# Patient Record
Sex: Male | Born: 1985 | Hispanic: Yes | Marital: Married | Smoking: Never smoker
Health system: Southern US, Community
[De-identification: ages and names within clinical notes are randomized; demographics above are authoritative.]

---

## 2020-12-14 ENCOUNTER — Emergency Department (HOSPITAL_COMMUNITY): Payer: No Typology Code available for payment source

## 2020-12-14 ENCOUNTER — Emergency Department (HOSPITAL_COMMUNITY)
Admission: EM | Admit: 2020-12-14 | Discharge: 2020-12-14 | Disposition: A | Discharge status: Home / Self Care | Payer: No Typology Code available for payment source | Attending: Emergency Medicine | Admitting: Emergency Medicine

## 2020-12-14 ENCOUNTER — Encounter (HOSPITAL_COMMUNITY): Payer: Self-pay

## 2020-12-14 DIAGNOSIS — Y92410 Unspecified street and highway as the place of occurrence of the external cause: Secondary | ICD-10-CM | POA: Diagnosis not present

## 2020-12-14 DIAGNOSIS — M545 Low back pain, unspecified: Secondary | ICD-10-CM | POA: Diagnosis present

## 2020-12-14 DIAGNOSIS — R0789 Other chest pain: Secondary | ICD-10-CM | POA: Diagnosis not present

## 2020-12-14 DIAGNOSIS — R519 Headache, unspecified: Secondary | ICD-10-CM | POA: Diagnosis not present

## 2020-12-14 DIAGNOSIS — M25512 Pain in left shoulder: Secondary | ICD-10-CM | POA: Diagnosis not present

## 2020-12-14 MED ORDER — METHOCARBAMOL 500 MG PO TABS: 500.0000 mg | ORAL_TABLET | Freq: Two times a day (BID) | ORAL | 0 refills | Status: DC

## 2020-12-14 MED ORDER — HYDROCODONE-ACETAMINOPHEN 5-325 MG PO TABS
1.0000 | ORAL_TABLET | Freq: Once | ORAL | Status: AC
  Administered 2020-12-14: 1 via ORAL
  Filled 2020-12-14: qty 1

## 2020-12-14 NOTE — Discharge Instructions (Signed)
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
## 2020-12-20 ENCOUNTER — Ambulatory Visit: Payer: Self-pay | Admitting: Physician Assistant

## 2020-12-20 ENCOUNTER — Other Ambulatory Visit: Payer: Self-pay

## 2020-12-20 VITALS — BP 135/80 | HR 84 | Temp 98.2°F | Resp 18 | Ht 68.0 in | Wt 215.0 lb

## 2020-12-20 DIAGNOSIS — M5442 Lumbago with sciatica, left side: Secondary | ICD-10-CM

## 2020-12-20 MED ORDER — IBUPROFEN 400 MG PO TABS: ORAL_TABLET | ORAL | 0 refills | Status: DC

## 2020-12-20 MED ORDER — METHYLPREDNISOLONE ACETATE 80 MG/ML IJ SUSP
80.0000 mg | Freq: Once | INTRAMUSCULAR | Status: AC
  Administered 2020-12-20: 80 mg via INTRAMUSCULAR

## 2020-12-20 MED ORDER — KETOROLAC TROMETHAMINE 30 MG/ML IJ SOLN
30.0000 mg | Freq: Once | INTRAMUSCULAR | Status: AC
  Administered 2020-12-20: 30 mg via INTRAMUSCULAR
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
## 2020-12-20 NOTE — Patient Instructions (Signed)
I encourage you to use the muscle relaxers and ibuprofen as directed.  Make sure to continue staying very well-hydrated, get plenty of rest, and doing the gentle stretching.  Please feel free to return to the mobile unit if needed.  Roney Jaffe, PA-C Physician Assistant [facility] [email or web address]   Citica Sciatica  La citica es el dolor, entumecimiento, debilidad u hormigueo a lo largo del nervio citico. El nervio citico comienza en la parte inferior de la espalda y desciende por la parte posterior de cada pierna. Controla los msculos en la parte inferior de las piernas y en la parte posterior de las rodillas. Tambin proporciona sensibilidad a la parte posterior de los muslos, la parte inferior de las piernas y la planta de los pies. La citica es un sntoma de otra afeccin que ejerce presin o "pellizca" el nervio citico. Con mayor frecuencia, la citica afecta a un solo lado del cuerpo. Suele desaparecer por s sola o con tratamiento. En algunos casos, la Hydrologist (ser recurrente). Cules son las causas? Esta afeccin es causada por una presin sobre el nervio citico o "pellizco" del nervio citico. Esto puede ser el resultado de:  Un disco que sobresale demasiado entre los huesos de la columna vertebral (hernia de disco).  Cambios en los discos vertebrales relacionados con la edad.  Un trastorno doloroso que afecta un msculo de las nalgas.  Un crecimiento seo adicional cerca del nervio citico.  Una rotura (fractura) de la pelvis.  Embarazo.  Tumor. Esto es poco frecuente. Qu incrementa el riesgo? Los siguientes factores pueden hacer que sea ms propenso a Aeronautical engineer afeccin:  Microbiologist que ponen presin o tensin sobre la columna vertebral.  Tener poca fuerza y flexibilidad.  Antecedentes de ciruga o lesin en la espalda.  Estar sentado durante largos perodos de  [place].  Realizar actividades que requieren agacharse o levantar objetos en forma repetida.  Obesidad. Cules son los signos o los sntomas? Los sntomas pueden ser leves o graves, y pueden incluir los siguientes:  Cualquiera de los siguientes problemas en la parte inferior de la espalda, piernas, cadera o nalgas: ? Hormigueo leve, adormecimiento o dolor sordo. ? Sensacin de ardor. ? Dolor agudo.  Adormecimiento de la parte posterior de la pantorrilla o la planta del pie.  Debilidad en las piernas.  Dolor de espalda intenso que dificulta el movimiento. Los sntomas podran empeorar al toser, Engineering geologist o rerse, o cuando se est sentado o de pie durante perodos prolongados. Cmo se diagnostica? Esta afeccin se puede diagnosticar en funcin de lo siguiente:  Los sntomas y los antecedentes mdicos.  Un examen fsico.  Anlisis de sangre.  Pruebas de diagnstico por imgenes, por ejemplo: ? Radiografas. ? Resonancia magntica (RM). ? Exploracin por tomografa computarizada (TC). Cmo se trata? En muchos casos, esta afeccin mejora por s sola, sin tratamiento. Sin embargo, el tratamiento puede incluir:  Reduccin o modificacin la actividad fsica.  Ejercicios y estiramientos.  Aplicacin de calor o hielo en la zona afectada.  Medicamentos para lo siguiente: ? Aliviar el dolor y la inflamacin. ? Relajar los msculos.  Medicamentos inyectables que ayudan a Engineer, materials, la irritacin y la inflamacin alrededor del nervio citico (corticoesteroides).  Ciruga. Siga estas instrucciones en su casa: Medicamentos  Tome los medicamentos de venta libre y los recetados solamente como se lo haya indicado el mdico.  Pregntele al mdico si el medicamento recetado: ? Hace que sea necesario que evite conducir o usar [employer]  pesada. ? Puede causarle estreimiento. Es posible que tenga que tomar estas medidas para prevenir o tratar el estreimiento:  Product manager  suficiente lquido como para Pharmacologist la orina de color amarillo plido.  Tomar medicamentos recetados o de [employer].  Consumir alimentos ricos en fibra, como frijoles, cereales integrales, y frutas y verduras frescas.  Limitar el consumo de alimentos ricos en grasa y azcares procesados, como los alimentos fritos o dulces. Control del dolor  Si se lo indican, aplique hielo sobre la zona afectada. ? Ponga el hielo en una bolsa plstica. ? Coloque una [employer] piel y Copy. ? Coloque el hielo durante , 2 a 3veces por da.  Si se lo indican, aplique calor en la zona afectada. Use la fuente de calor que el mdico le recomiende, como una compresa de calor hmedo o una almohadilla trmica. ? Coloque una [employer] piel y la fuente de Airline pilot. ? Aplique calor durante 20 a . ? Retire la fuente de calor si la piel se pone de color rojo brillante. Esto es especialmente importante si no puede sentir dolor, calor o fro. Puede correr un riesgo mayor de sufrir quemaduras.      Actividad  Retome sus actividades normales segn lo indicado por el mdico. Pregntele al mdico qu actividades son seguras para usted.  Evite las [employer] sntomas.  Durante el da, descanse durante lapsos breves. ? Cuando descanse durante perodos ms largos, incorpore alguna [place] o ejercicios de [employer] perodos de descanso. Esto ayudar a Transport planner rigidez y Chief Technology Officer. ? Evite estar sentado durante largos perodos de tiempo sin moverse. Levntese y [place] al menos una vez cada hora.  Haga ejercicio y elongue habitualmente, como se lo haya indicado el mdico.  No levante nada que pese ms de 10libras (4.5kg) mientras tenga sntomas de citica. Aunque no tenga sntomas, evite levantar objetos pesados, en especial en forma repetida.  Siempre use las tcnicas de levantamiento correctas para levantar objetos, entre ellas: ? Flexionar las  rodillas. ? Mantener la carga cerca del cuerpo. ? No torcerse.   Instrucciones generales  Mantenga un peso saludable. El exceso de peso ejerce tensin adicional sobre la espalda.  Use calzado con buen apoyo y cmodo. Evite usar tacones.  Evite dormir sobre un colchn que sea demasiado blando o demasiado duro. Un colchn que ofrezca un apoyo suficientemente firme para su espalda al dormir puede ayudar a Engineer, materials.  Concurra a todas las visitas de [address] se lo haya indicado el mdico. Esto es importante. Comunquese con un mdico si:  Tiene un dolor con estas caractersticas: ? Lo despierta cuando est dormido. ? Empeora al estar recostado. ? Es Teacher, music del que experiment en el pasado. ? Dura ms de 4semanas.  Pierde peso de [place] inexplicable. Solicite ayuda inmediatamente si:  No puede controlar cundo Automotive engineer (incontinencia).  Tiene lo siguiente: ? Debilidad que empeora en la parte inferior de la espalda, la pelvis, las nalgas o las piernas. ? Enrojecimiento o inflamacin en la espalda. ? Sensacin de ardor al [employer]. Resumen  La citica es Chief Technology Officer, entumecimiento, debilidad u hormigueo a lo largo del nervio citico.  Esta afeccin es causada por una presin sobre el nervio citico o "pellizco" del nervio citico.  La citica puede Programmer, multimedia, adormecimiento u hormigueo en la parte inferior de la espalda, las piernas, las caderas y las nalgas.  El tratamiento a menudo incluye reposo, ejercicios, medicamentos y  aplicar hielo o calor. Esta informacin no tiene Theme park manager el consejo del mdico. Asegrese de hacerle al mdico cualquier pregunta que tenga. Document Revised: 09/25/2018 Document Reviewed: 09/25/2018 Elsevier Patient Education  2021 [employer].
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
## 2020-12-21 ENCOUNTER — Ambulatory Visit (HOSPITAL_COMMUNITY): Payer: Self-pay

## 2020-12-21 DIAGNOSIS — M5442 Lumbago with sciatica, left side: Secondary | ICD-10-CM | POA: Insufficient documentation

## 2021-01-27 ENCOUNTER — Ambulatory Visit: Payer: Self-pay | Attending: Critical Care Medicine | Admitting: Critical Care Medicine

## 2021-01-27 ENCOUNTER — Encounter: Payer: Self-pay | Admitting: Critical Care Medicine

## 2021-01-27 ENCOUNTER — Other Ambulatory Visit: Payer: Self-pay

## 2021-01-27 DIAGNOSIS — Z139 Encounter for screening, unspecified: Secondary | ICD-10-CM

## 2021-01-27 DIAGNOSIS — M5442 Lumbago with sciatica, left side: Secondary | ICD-10-CM

## 2021-01-27 MED ORDER — LIDOCAINE 5 % EX PTCH
1.0000 | MEDICATED_PATCH | CUTANEOUS | 0 refills | Status: AC
  Filled 2021-01-27: qty 30, 30d supply, fill #0

## 2021-01-27 MED ORDER — CYCLOBENZAPRINE HCL 5 MG PO TABS
5.0000 mg | ORAL_TABLET | Freq: Three times a day (TID) | ORAL | 1 refills | Status: AC | PRN
  Filled 2021-01-27: qty 30, 10d supply, fill #0

## 2021-01-27 NOTE — Assessment & Plan Note (Signed)
Patient choosing to defer health screening labs until he can research applying for health insurance through his employer.

## 2021-01-27 NOTE — Patient Instructions (Signed)
We discussed that you will perform the back exercises see attachment  We discussed she will continue with her chiropractor to see what improvement you get in your back  Begin lidocaine patch apply for 12 hours a day once a day prescription sent to our pharmacy  Discontinue Robaxin and begin Flexeril 5 mg 3 times daily as needed for back muscle spasm  Discontinue ibuprofen and instead use Tylenol extra strength 1000 mg 4-5 times daily as needed for pain  Continue to ice your back down as needed  We discussed we will hold off on any primary care lab screenings today  Follow healthy diet as outlined below  Follow back exercise protocol as outlined below note if you do not get improvement with the chiropractor we will make a referral to physical therapy  Return to see Dr. Delford Field 2 months  Please call and let us know if you achieve insurance from your job  Te comentamos que vas a Education officer, environmental los ejercicios de espalda ver adjunto  Discutimos que continuar con su quiroprctico para ver qu mejora obtiene en su espalda.  Comience a aplicar el parche de lidocana durante 12 horas al da, una vez al da. Receta enviada a nuestra farmacia.  Suspender Robaxin y comenzar Flexeril 5 mg 3 veces al da segn sea necesario para el espasmo muscular de la espalda  Suspenda el ibuprofeno y en su lugar use Tylenol extra fuerte 1000 mg 4-5 veces al da segn sea necesario para el dolor  Contine ponindose hielo en la espalda segn sea necesario  Discutimos que pospondremos cualquier evaluacin de laboratorio de atencin primaria hoy.  Siga una dieta [employer] se describe a continuacin  Siga el protocolo de ejercicio como se describe a continuacin. Tenga en cuenta que si no mejora con el quiroprctico, lo derivaremos a fisioterapia.  Volver a ver al Dr. Delford Field 2 meses  Llame y hganos saber si obtiene un seguro de su trabajo.

## 2021-01-27 NOTE — Assessment & Plan Note (Signed)
Take Flexeril 5 mg three times daily as needed for back pain. Use lidocaine patch on the lower back for 12 hours as discussed. Provided patient with back stretching and strengthening exercise.  Can continue to have regular treatment with chiropractor.  Discussed will provide patient with referral to physical therapy if today's treatment plan does not lead to resolution of symptoms. Follow up at next appointment.
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
## 2021-02-03 ENCOUNTER — Other Ambulatory Visit: Payer: Self-pay

## 2021-02-16 ENCOUNTER — Encounter (HOSPITAL_COMMUNITY): Payer: Self-pay

## 2021-02-16 ENCOUNTER — Emergency Department (HOSPITAL_COMMUNITY)
Admission: EM | Admit: 2021-02-16 | Discharge: 2021-02-16 | Disposition: A | Discharge status: Home / Self Care | Payer: Self-pay | Attending: Emergency Medicine | Admitting: Emergency Medicine

## 2021-02-16 DIAGNOSIS — X102XXA Contact with fats and cooking oils, initial encounter: Secondary | ICD-10-CM | POA: Insufficient documentation

## 2021-02-16 DIAGNOSIS — T22212A Burn of second degree of left forearm, initial encounter: Secondary | ICD-10-CM | POA: Insufficient documentation

## 2021-02-16 DIAGNOSIS — T23271A Burn of second degree of right wrist, initial encounter: Secondary | ICD-10-CM | POA: Insufficient documentation

## 2021-02-16 DIAGNOSIS — T310 Burns involving less than 10% of body surface: Secondary | ICD-10-CM | POA: Insufficient documentation

## 2021-02-16 DIAGNOSIS — T25221A Burn of second degree of right foot, initial encounter: Secondary | ICD-10-CM | POA: Insufficient documentation

## 2021-02-16 DIAGNOSIS — T300 Burn of unspecified body region, unspecified degree: Secondary | ICD-10-CM

## 2021-02-16 MED ORDER — OXYCODONE-ACETAMINOPHEN 5-325 MG PO TABS: 1.0000 | ORAL_TABLET | Freq: Four times a day (QID) | ORAL | 0 refills | Status: AC | PRN

## 2021-02-16 MED ORDER — SILVER SULFADIAZINE 1 % EX CREA
TOPICAL_CREAM | Freq: Once | CUTANEOUS | Status: AC
  Filled 2021-02-16: qty 50

## 2021-02-16 MED ORDER — SILVER SULFADIAZINE 1 % EX CREA: 1.0000 "application " | TOPICAL_CREAM | Freq: Every day | CUTANEOUS | 0 refills | Status: AC

## 2021-02-16 MED ORDER — OXYCODONE-ACETAMINOPHEN 5-325 MG PO TABS
1.0000 | ORAL_TABLET | Freq: Once | ORAL | Status: AC
  Administered 2021-02-16: 1 via ORAL
  Filled 2021-02-16: qty 1

## 2021-02-16 NOTE — ED Notes (Signed)
Pt states "I'm good, I want to go", provider made aware
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
## 2021-02-16 NOTE — ED Notes (Signed)
Gave pt discharge papers that were in spanish, went over rx and to not drive or operate heavy machinery while taking percocet. Pt ambulates independently, steady gait, wife here to pick pt up, pt ready for discharge, advised to come back for any complications

## 2021-02-16 NOTE — ED Notes (Signed)
Pt stated he did not want tdap, provider made aware
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
## 2021-03-29 ENCOUNTER — Ambulatory Visit: Payer: Self-pay | Admitting: Critical Care Medicine

## 2021-05-31 ENCOUNTER — Telehealth: Payer: Self-pay | Admitting: Critical Care Medicine

## 2021-06-14 ENCOUNTER — Ambulatory Visit: Payer: Self-pay | Admitting: Critical Care Medicine
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
